# Patient Record
Sex: Female | Born: 2000 | Race: Black or African American | Hispanic: No | Marital: Single | Smoking: Never smoker
Health system: Southern US, Community
[De-identification: ages and names within clinical notes are randomized; demographics above are authoritative.]

---
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
## 2024-05-26 ENCOUNTER — Ambulatory Visit: Payer: Self-pay | Admitting: Family Medicine

## 2024-05-26 ENCOUNTER — Encounter: Payer: Self-pay | Admitting: Family Medicine

## 2024-05-26 VITALS — BP 116/74 | HR 68 | Wt 136.4 lb

## 2024-05-26 DIAGNOSIS — Z124 Encounter for screening for malignant neoplasm of cervix: Secondary | ICD-10-CM

## 2024-05-26 DIAGNOSIS — Z3009 Encounter for other general counseling and advice on contraception: Secondary | ICD-10-CM | POA: Diagnosis not present

## 2024-05-26 DIAGNOSIS — Z113 Encounter for screening for infections with a predominantly sexual mode of transmission: Secondary | ICD-10-CM

## 2024-05-26 DIAGNOSIS — Z01419 Encounter for gynecological examination (general) (routine) without abnormal findings: Secondary | ICD-10-CM | POA: Insufficient documentation

## 2024-05-26 LAB — WET PREP FOR TRICH, YEAST, CLUE
Clue Cell Exam: NEGATIVE
Trichomonas Exam: NEGATIVE
Yeast Exam: NEGATIVE

## 2024-05-26 NOTE — Patient Instructions (Signed)
 PAP Smear Today we performed a PAP smear to screen for cervical cancer. The results should be back in 1-2 weeks.  Once we have the results we can determine when your next screening should be.    STI screening - Today we obtained a vaginal swab to screen for gonorrhea, chlamydia, and trichomonas and oral swab to screen for gonorrhea - If the results are abnormal, I will give you a call.    Estimated time frame for results collected at the [facility] Department: Same day Trichomonas Yeast BV (bacterial vaginosis)  Within 3-5 days Gonorrhea Chlamydia  Within 1 week HIV Syphilis Hepatitis B Hepatitis C

## 2024-05-26 NOTE — Progress Notes (Signed)
 Pt is here for PE/pap test. Wet prep results reviewed with patient and requires no treatment per SO. Opportunity given to patient to ask questions for any clarifications. Qestions answered.  Condoms declined and FP card given. Kwadwo Kandas Oliveto,RN.

## 2024-05-26 NOTE — Assessment & Plan Note (Signed)
 Unremarkable well woman exam. PAP smear and STI testing for G/C, trich obtained. Declines blood work today due to fear of needles.

## 2024-05-26 NOTE — Progress Notes (Signed)
 [facility] [address] [place] [address] Main phone: [phone]  Family Planning Visit - Initial Visit  Subjective:  LERA GAINES is a 23 y.o.  [id number]   being seen today for an initial annual visit and to discuss reproductive life planning.  The patient is currently using female condom for pregnancy prevention. Patient does not want a pregnancy in the next year.   Patient reports they are looking for a method with the following characteristics:  High efficacy at preventing pregnancy  Patient has the following medical conditions: Patient Active Problem List   Diagnosis Date Noted   Well woman exam 05/26/2024   Family history of breast cancer 09/15/2018   No chief complaint on file.  HPI Patient reports she would like an annual physical, PAP smear, and STI testing (vaginal swabs only). Declines blood tests. No vaginal complaints. Last sex 9/14, LMP 05/08/2024. Uses condoms for contraception, has experienced mood swings and weight gain with past contraceptive methods.   Review of Systems  Constitutional:  Negative for fever, malaise/fatigue and weight loss.  Respiratory:  Negative for shortness of breath.   Cardiovascular:  Negative for chest pain and palpitations.   Diabetes screening This patient is 23 y.o. with a BMI of Body mass index is 23.41 kg/m.SABRA  Is patient eligible for diabetes screening (age >35 and BMI >25)?  no  Was Hgb A1c ordered? not applicable  STI screening Patient reports 1 of partners in last year.  Does this patient desire STI screening?  Yes  Hepatitis C screening Has patient been screened once for HCV in the past?  No  No results found for: HCVAB  Does the patient meet criteria for HCV testing? No   Hepatitis B screening Does the patient meet criteria for HBV testing? No  Cervical Cancer Screening  No Cervical Cancer Screening results to display. Patient reports one  lifetime PAP smear, was told it was normal. No colposcopy or biopsies ever.   Health Maintenance Due  Topic Date Due   HPV VACCINES (1 - 3-dose series) Never done   HIV Screening  Never done   Meningococcal B Vaccine (1 of 2 - Standard) Never done   Hepatitis C Screening  Never done   DTaP/Tdap/Td (1 - Tdap) Never done   Hepatitis B Vaccines 19-59 Average Risk (1 of 3 - 19+ 3-dose series) Never done   CHLAMYDIA SCREENING  09/15/2019   Cervical Cancer Screening (Pap smear)  Never done   Influenza Vaccine  Never done   COVID-19 Vaccine (1 - 2025-26 season) Never done   The following portions of the patient's history were reviewed and updated as appropriate: allergies, current medications, past family history, past medical history, past social history, past surgical history and problem list. Problem list updated.  See flowsheet for further details and programmatic requirements Hyperlink available at the top of the signed note in blue.  Flow sheet content below:  Pregnancy Intention Screening Does the patient want to become pregnant in the next year?: No Does the patient's partner want to become pregnant in the next year?: No Would the patient like to discuss contraceptive options today?: No Other:  Password: [email or web address] Sexual History What age did you start your period?: 13 How often do you have your period?: monthly Date of last sex?: 03/27/24 Has the patient had unprotected sex within the last 5 days?: No Do you have sex with men, women, both men and women?: Men only  In the past 2 months how many partners have you had sex with?: 1 In the past 12 months, how many partners have you had sex with?: 1 Is it possible that any of your sex partners in the past 12 months had sex with someone else whild they were still in a sexual relationship with you?: No What ways do you have sex?: Vaginal, Oral Do you or your partner use condoms and/or dental dams every time you have vaginal, oral or anal  sex?: Sometimes Do you douche?: No Date of last HIV test?:  (none) Have you ever had an STD?: No Have any of your partners had an STD?: No Have you or your partner ever shot up drugs?: No Have any of your partners used drugs in the past?: No Have you or your partners exchanged money or drugs for sex?: No Risk Factors for Hep B Household, sexual, or needle sharing contact of a person infected with Hep B: No Sexual contact with a person who uses drugs not as prescribed?: No Currently or Ever used drugs not as prescribed: No HIV Positive: No PRep Patient: No Men who have sex with men: N/A Have Hepatitis C: No History of Incarceration: No History of Homeslessness?: No Anal sex following anal drug use?: N/A Risk Factors for Hep C Currently using drugs not as prescribed: No Sexual partner(s) currently using drugs as not prescribed: No History of drug use: No HIV Positive: No People with a history of incarceration: No People born between the years of 13 and 46: No  Objective:   Vitals:   05/26/24 1357  BP: 116/74  Pulse: 68  Weight: 136 lb 6.4 oz (61.9 kg)   Physical Exam Vitals and nursing note reviewed.  Constitutional:      Appearance: Normal appearance.  HENT:     Head: Normocephalic and atraumatic.     Mouth/Throat:     Mouth: Mucous membranes are moist.     Pharynx: Oropharynx is clear. No oropharyngeal exudate or posterior oropharyngeal erythema.  Eyes:     General: No scleral icterus.       Right eye: No discharge.        Left eye: No discharge.  Cardiovascular:     Rate and Rhythm: Normal rate and regular rhythm.     Heart sounds: Normal heart sounds.  Pulmonary:     Effort: Pulmonary effort is normal.     Breath sounds: Normal breath sounds.  Abdominal:     General: Abdomen is flat.     Palpations: There is no mass.     Tenderness: There is no abdominal tenderness. There is no rebound.  Genitourinary:    General: Normal vulva.     Exam position:  Lithotomy position.     Pubic Area: No rash or pubic lice.      Tanner stage (genital): 5.     Labia:        Right: No rash or lesion.        Left: No rash or lesion.      Vagina: Normal. No vaginal discharge, erythema, bleeding or lesions.     Cervix: No cervical motion tenderness, discharge, friability, lesion or erythema.     Comments: pH = 3 Musculoskeletal:     Cervical back: Neck supple. No rigidity or tenderness.  Lymphadenopathy:     Head:     Right side of head: No preauricular or posterior auricular adenopathy.     Left side of head: No preauricular or  posterior auricular adenopathy.     Cervical: No cervical adenopathy.     Upper Body:     Right upper body: No supraclavicular adenopathy.     Left upper body: No supraclavicular adenopathy.  Skin:    General: Skin is warm and dry.     Coloration: Skin is not jaundiced or pale.     Findings: No bruising, erythema, lesion or rash.  Neurological:     Mental Status: She is alert and oriented to person, place, and time.  Psychiatric:        Mood and Affect: Mood normal.        Behavior: Behavior normal.    Assessment and Plan:  ALVERDA NAZZARO is a 23 y.o. female presenting to the [facility] Department for an initial annual wellness/contraceptive visit  Contraception counseling:  Reviewed options based on patient desire and reproductive life plan. Patient is interested in Female Condom. This was provided to the patient today.   Risks, benefits, and typical effectiveness rates were reviewed.  Questions were answered.  Written information was also given to the patient to review.    The patient will follow up in  1 years for surveillance.  The patient was told to call with any further questions, or with any concerns about this method of contraception.  Emphasized use of condoms 100% of the time for STI prevention.  Emergency Contraception Precautions (ECP): Patient assessed for need of ECP. She is not a candidate  based on no intercourse since LMP.  Prefers [facility] in [place]  Well woman exam Assessment & Plan: Unremarkable well woman exam. PAP smear and STI testing for G/C, trich obtained. Declines blood work today due to fear of needles.    Cervical cancer screening -     IGP, rfx Aptima HPV ASCU  Screening examination for venereal disease -     Chlamydia/Gonorrhea [facility] -     WET PREP FOR TRICH, YEAST, CLUE -     Gonococcus culture  No follow-ups on file.  No future appointments.  Betsey CHRISTELLA Helling, MD

## 2024-05-30 ENCOUNTER — Ambulatory Visit: Payer: Self-pay | Admitting: Family Medicine

## 2024-05-30 LAB — IGP, RFX APTIMA HPV ASCU: PAP Smear Comment: 0

## 2024-05-30 LAB — GONOCOCCUS CULTURE

## 2024-05-30 NOTE — Progress Notes (Signed)
 PAP smear reviewed, result: NILM (normal), HPV co-testing not indicated. Based on this result and patient's prior cervical cancer screenings, [facility] currently recommends repeat PAP smear in 3 years.  Dorothyann Helling, MD 05/30/24  4:19 PM

## 2024-05-31 NOTE — Progress Notes (Signed)
 Pap card mailed Larraine JONELLE Novak, RN
